# Patient Record
Sex: Female | Born: 1985
Health system: Southern US, Community
[De-identification: ages and names within clinical notes are randomized; demographics above are authoritative.]

## PROBLEM LIST (undated history)

## (undated) DIAGNOSIS — G43829 Menstrual migraine, not intractable, without status migrainosus: Secondary | ICD-10-CM

## (undated) DIAGNOSIS — Z1379 Encounter for other screening for genetic and chromosomal anomalies: Secondary | ICD-10-CM

## (undated) DIAGNOSIS — Z803 Family history of malignant neoplasm of breast: Secondary | ICD-10-CM

## (undated) DIAGNOSIS — F32A Depression, unspecified: Secondary | ICD-10-CM

## (undated) DIAGNOSIS — Z9289 Personal history of other medical treatment: Secondary | ICD-10-CM

## (undated) DIAGNOSIS — Z1371 Encounter for nonprocreative screening for genetic disease carrier status: Secondary | ICD-10-CM

## (undated) DIAGNOSIS — R87619 Unspecified abnormal cytological findings in specimens from cervix uteri: Secondary | ICD-10-CM

## (undated) DIAGNOSIS — Z8041 Family history of malignant neoplasm of ovary: Secondary | ICD-10-CM

## (undated) DIAGNOSIS — B977 Papillomavirus as the cause of diseases classified elsewhere: Secondary | ICD-10-CM

## (undated) DIAGNOSIS — F419 Anxiety disorder, unspecified: Secondary | ICD-10-CM

## (undated) DIAGNOSIS — F329 Major depressive disorder, single episode, unspecified: Secondary | ICD-10-CM

## (undated) DIAGNOSIS — E559 Vitamin D deficiency, unspecified: Secondary | ICD-10-CM

## (undated) DIAGNOSIS — Z9189 Other specified personal risk factors, not elsewhere classified: Secondary | ICD-10-CM

## (undated) HISTORY — DX: Papillomavirus as the cause of diseases classified elsewhere: B97.7

## (undated) HISTORY — DX: Major depressive disorder, single episode, unspecified: F32.9

## (undated) HISTORY — DX: Unspecified abnormal cytological findings in specimens from cervix uteri: R87.619

## (undated) HISTORY — DX: Anxiety disorder, unspecified: F41.9

## (undated) HISTORY — DX: Menstrual migraine, not intractable, without status migrainosus: G43.829

## (undated) HISTORY — PX: NO PAST SURGERIES: SHX2092

## (undated) HISTORY — DX: Family history of malignant neoplasm of ovary: Z80.41

## (undated) HISTORY — DX: Depression, unspecified: F32.A

## (undated) HISTORY — DX: Encounter for other screening for genetic and chromosomal anomalies: Z13.79

## (undated) HISTORY — DX: Vitamin D deficiency, unspecified: E55.9

## (undated) HISTORY — DX: Family history of malignant neoplasm of breast: Z80.3

## (undated) HISTORY — DX: Personal history of other medical treatment: Z92.89

## (undated) HISTORY — DX: Other specified personal risk factors, not elsewhere classified: Z91.89

---
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
## 2006-07-23 DIAGNOSIS — B977 Papillomavirus as the cause of diseases classified elsewhere: Secondary | ICD-10-CM

## 2006-07-23 HISTORY — DX: Papillomavirus as the cause of diseases classified elsewhere: B97.7

## 2006-08-07 ENCOUNTER — Emergency Department (HOSPITAL_COMMUNITY): Admission: EM | Admit: 2006-08-07 | Discharge: 2006-08-07 | Payer: Self-pay | Admitting: Emergency Medicine

## 2006-09-01 ENCOUNTER — Emergency Department (HOSPITAL_COMMUNITY): Admission: EM | Admit: 2006-09-01 | Discharge: 2006-09-01 | Payer: Self-pay | Admitting: *Deleted

## 2007-12-08 IMAGING — CT CT HEAD W/O CM
1 of 2 series · 13 of 30 positions shown, 17 images · IV contrast (agent unspecified)
Comparison: None.

CLINICAL DATA: Headaches for 5 days.  Nausea.  
 HEAD CT WITHOUT CONTRAST:
TECHNIQUE: Contiguous axial images were obtained from the base of the skull through the vertex according to standard protocol without contrast.

[Series 2: brain · axial · 0.47mm/px · z∈[+119,+240]mm · 13 of 28 slices shown, 17 images]
[im 2/28  brain]
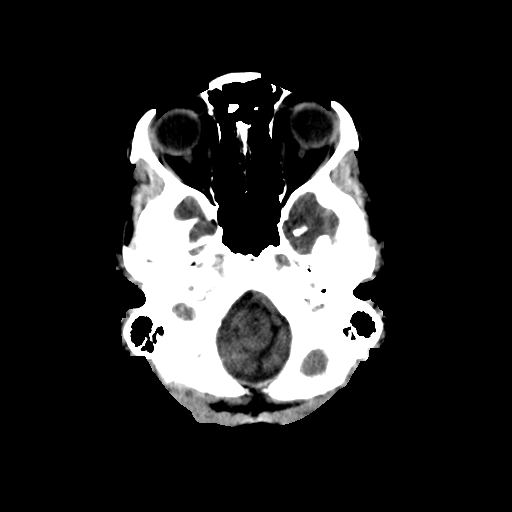
[im 2/28  bone]
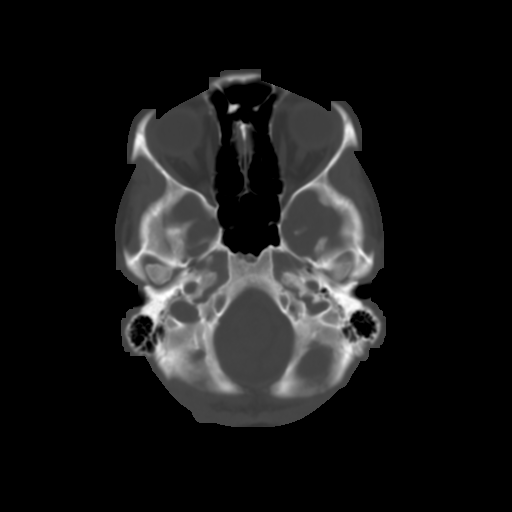
[im 4/28  brain]
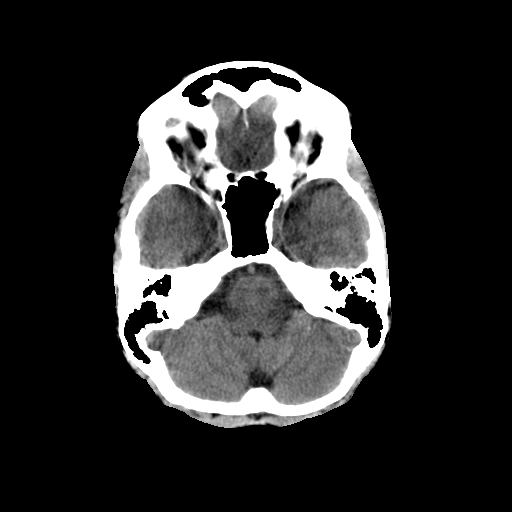
[im 6/28  brain]
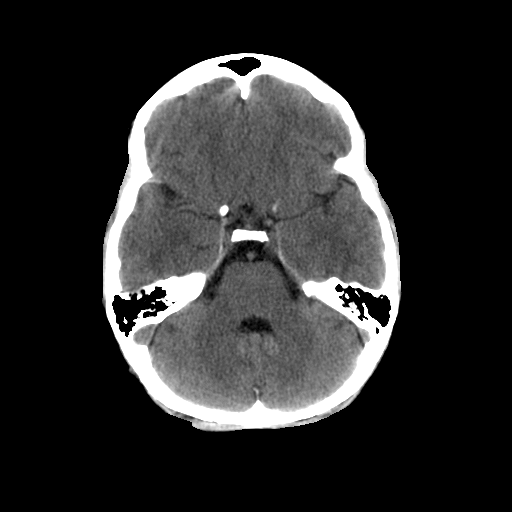
[im 8/28  brain]
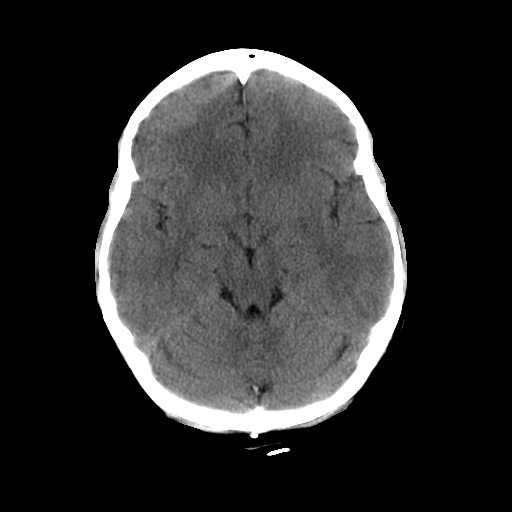
[im 10/28  brain]
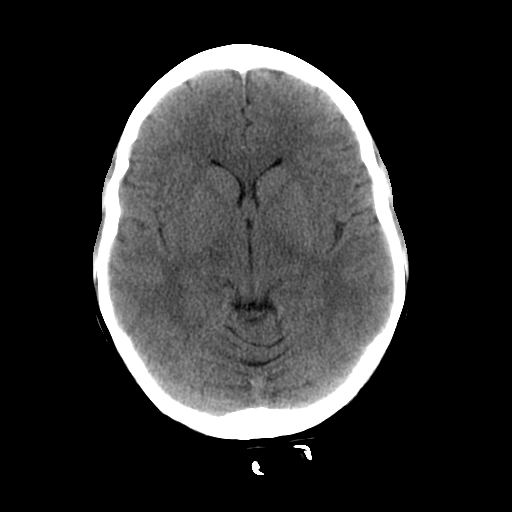
[im 10/28  bone]
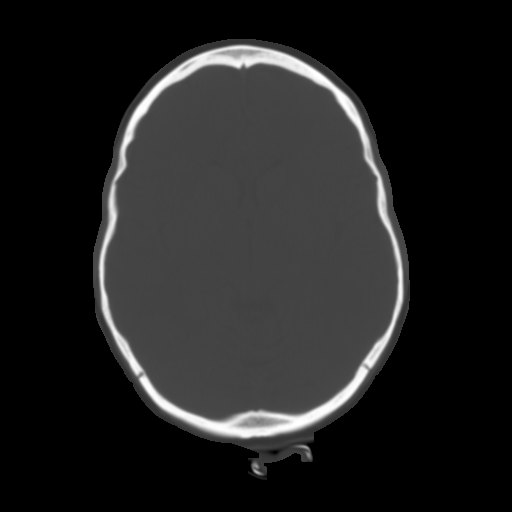
[im 12/28  brain]
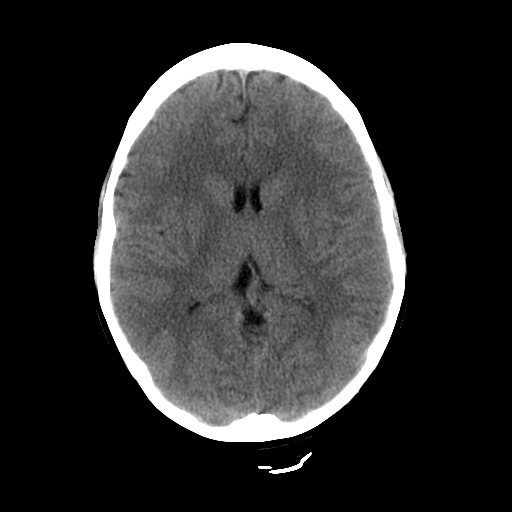
[im 14/28  brain]
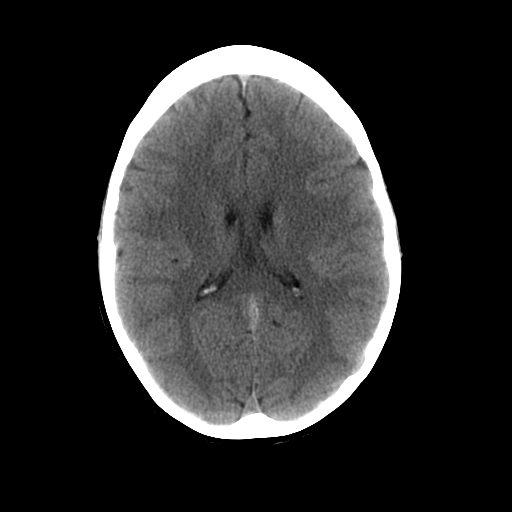
[im 16/28  brain]
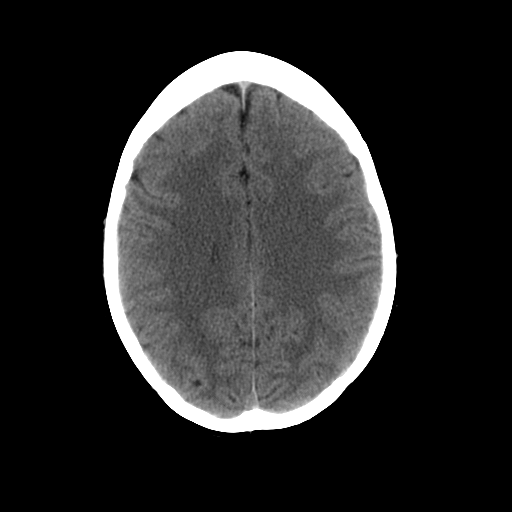
[im 18/28  brain]
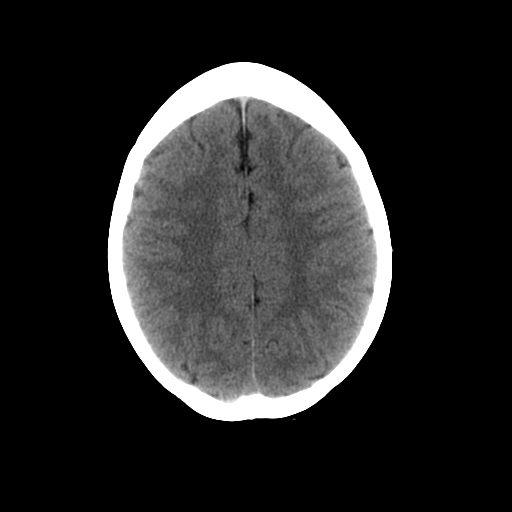
[im 18/28  bone]
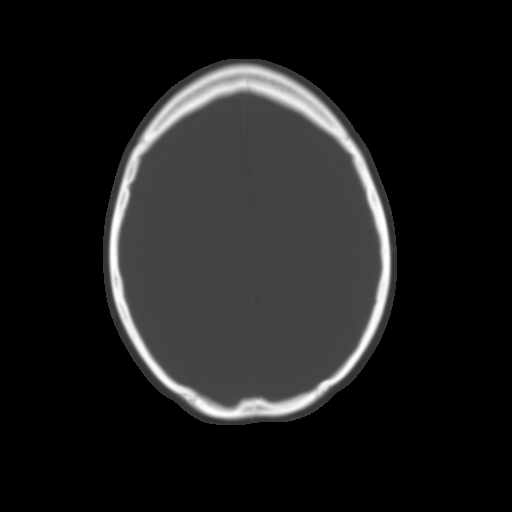
[im 20/28  brain]
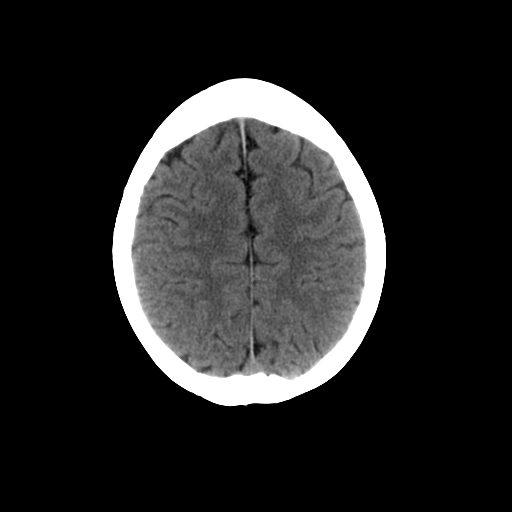
[im 22/28  brain]
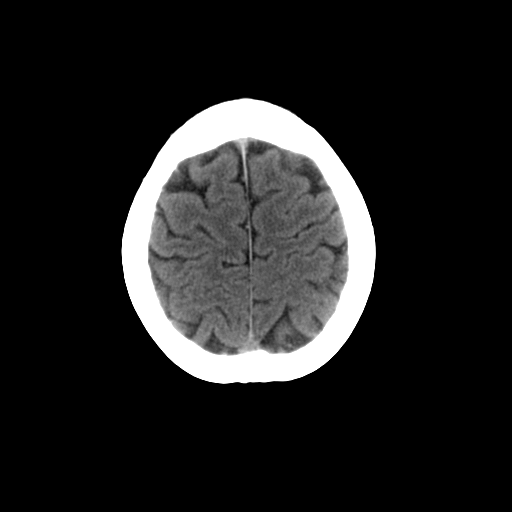
[im 24/28  brain]
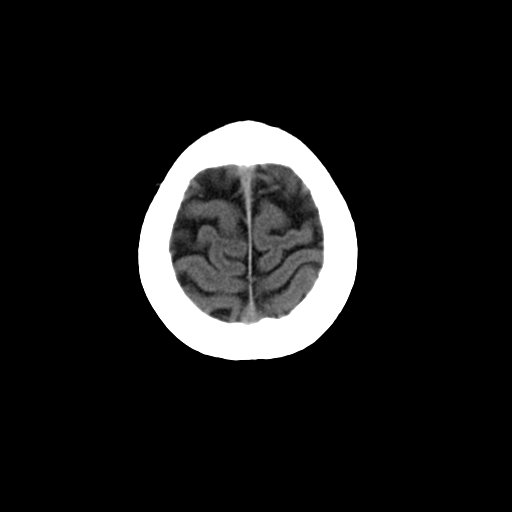
[im 26/28  brain]
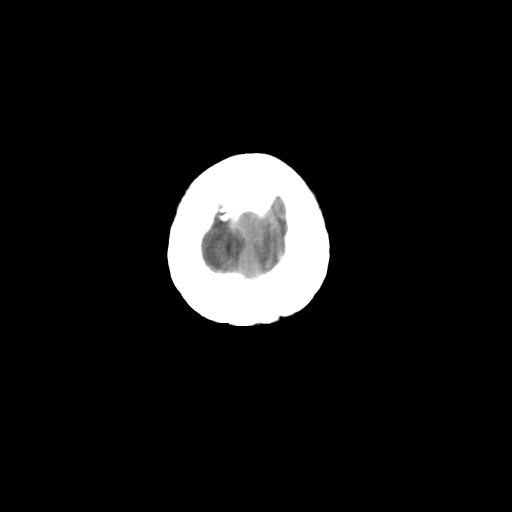
[im 26/28  bone]
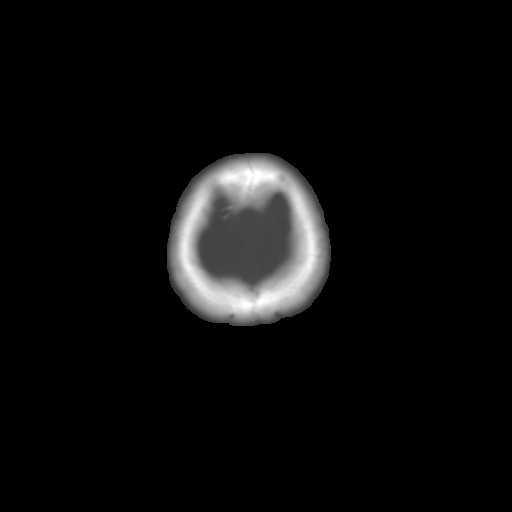

[13 of 30 positions shown; findings below may reference images not displayed]

FINDINGS: The brain parenchyma is normal in attenuation and morphology.  
 No abnormal extraaxial fluid collection, intracranial hemorrhage, or mass is noted.  
 The midline is maintained.  There is no edema or mass effect.  
 Mastoid air cells and paranasal sinuses are normally aerated.
IMPRESSION: No acute intracranial abnormalities.

## 2014-03-11 DIAGNOSIS — Z1371 Encounter for nonprocreative screening for genetic disease carrier status: Secondary | ICD-10-CM

## 2014-03-11 HISTORY — DX: Encounter for nonprocreative screening for genetic disease carrier status: Z13.71

## 2015-07-26 LAB — HM MAMMOGRAPHY

## 2016-07-03 LAB — HM PAP SMEAR

## 2016-12-24 ENCOUNTER — Encounter: Payer: Self-pay | Admitting: Obstetrics and Gynecology

## 2016-12-26 ENCOUNTER — Encounter: Payer: Self-pay | Admitting: Obstetrics and Gynecology

## 2016-12-26 ENCOUNTER — Ambulatory Visit (INDEPENDENT_AMBULATORY_CARE_PROVIDER_SITE_OTHER): Payer: 59 | Admitting: Obstetrics and Gynecology

## 2016-12-26 VITALS — BP 118/70 | Ht 66.0 in | Wt 170.0 lb

## 2016-12-26 DIAGNOSIS — Z8742 Personal history of other diseases of the female genital tract: Secondary | ICD-10-CM

## 2016-12-26 DIAGNOSIS — R5383 Other fatigue: Secondary | ICD-10-CM

## 2016-12-26 DIAGNOSIS — L659 Nonscarring hair loss, unspecified: Secondary | ICD-10-CM | POA: Diagnosis not present

## 2016-12-26 DIAGNOSIS — Z124 Encounter for screening for malignant neoplasm of cervix: Secondary | ICD-10-CM

## 2016-12-26 DIAGNOSIS — Z1151 Encounter for screening for human papillomavirus (HPV): Secondary | ICD-10-CM | POA: Diagnosis not present

## 2016-12-26 DIAGNOSIS — Z87898 Personal history of other specified conditions: Secondary | ICD-10-CM

## 2016-12-26 NOTE — Progress Notes (Signed)
   Chief Complaint  Patient presents with  . Follow-up    6 month pap repeat pap smear     HPI:      Ms. Beth Walsh is a 31 y.o. G0P0000 who LMP was Patient's last menstrual period was 12/19/2016., presents today for repeat pap smear. Pt is s/p LGSIL 1/17, no bx on 2/17 colpo with Dr. Luella Cookosenow. 1/18 pap at annual was neg/POS HPV DNA. Repeat today. Pt stopped OCPs 5/18 in order to conceive. She is taking PNVs. She has noticed fatigue and hair loss recently. She would like lab check.   There are no active problems to display for this patient.   Family History  Problem Relation Age of Onset  . Diabetes Maternal Grandfather   . Diabetes Paternal Grandfather   . Cancer Paternal Aunt [id number]       BREAST  . Cancer Maternal Aunt [id number]    Social History   Social History  . Marital status: Divorced    Spouse name: N/A  . Number of children: 0  . Years of education: 14   Occupational History  . NURSE    Social History Main Topics  . Smoking status: Never Smoker  . Smokeless tobacco: Never Used  . Alcohol use Yes     Comment: OCC   . Drug use: No  . Sexual activity: Yes    Birth control/ protection: Pill   Other Topics Concern  . Not on file   Social History Narrative  . No narrative on file     Current Outpatient Prescriptions:  .  Cholecalciferol (VITAMIN D3) 1000 units CAPS, Take by mouth., Disp: , Rfl:  .  Prenatal Vit-Fe Fumarate-FA (PRENATAL VITAMIN PO), Take by mouth., Disp: , Rfl:   Review of Systems  Constitutional: Positive for fatigue. Negative for fever.  Gastrointestinal: Negative for blood in stool, constipation, diarrhea, nausea and vomiting.  Genitourinary: Negative for dyspareunia, dysuria, flank pain, frequency, hematuria, urgency, vaginal bleeding, vaginal discharge and vaginal pain.  Musculoskeletal: Negative for back pain.  Skin: Negative for rash.     OBJECTIVE:   Vitals:  BP 118/70   Ht 5\' 6"  (1.676 m)   Wt 170 lb (77.1 kg)   LMP  12/19/2016   BMI 27.44 kg/m   Physical Exam  Constitutional: She is oriented to person, place, and time and well-developed, well-nourished, and in no distress. Vital signs are normal.  Genitourinary: Vagina normal, uterus normal, cervix normal, right adnexa normal, left adnexa normal and vulva normal. Uterus is not enlarged. Cervix exhibits no motion tenderness and no tenderness. Right adnexum displays no mass and no tenderness. Left adnexum displays no mass and no tenderness. Vulva exhibits no erythema, no exudate, no lesion, no rash and no tenderness. Vagina exhibits no lesion.  Neurological: She is oriented to person, place, and time.  Vitals reviewed.    Assessment/Plan: Cervical cancer screening - Plan: IGP, Aptima HPV  Screening for HPV (human papillomavirus) - Plan: IGP, Aptima HPV  History of abnormal cervical Pap smear - Will call pt with results.  Fatigue, unspecified type - Check labs.  - Plan: Comprehensive metabolic panel, TSH + free T4  Hair loss - Plan: Comprehensive metabolic panel, TSH + free T4    Return in about 6 months (around 06/28/2017) for annual.  Alicia B. Copland, PA-C 12/26/2016 2:41 PM

## 2016-12-27 LAB — COMPREHENSIVE METABOLIC PANEL
ALK PHOS: 62 IU/L (ref 39–117)
ALT: 13 IU/L (ref 0–32)
AST: 19 IU/L (ref 0–40)
Albumin/Globulin Ratio: 1.6 (ref 1.2–2.2)
Albumin: 4.3 g/dL (ref 3.5–5.5)
BUN/Creatinine Ratio: 22 (ref 9–23)
BUN: 13 mg/dL (ref 6–20)
Bilirubin Total: 0.3 mg/dL (ref 0.0–1.2)
CO2: 24 mmol/L (ref 20–29)
CREATININE: 0.6 mg/dL (ref 0.57–1.00)
Calcium: 9.6 mg/dL (ref 8.7–10.2)
Chloride: 105 mmol/L (ref 96–106)
GFR calc Af Amer: 139 mL/min/{1.73_m2} (ref >59)
GFR calc non Af Amer: 121 mL/min/{1.73_m2} (ref >59)
GLOBULIN, TOTAL: 2.7 g/dL (ref 1.5–4.5)
Glucose: 84 mg/dL (ref 65–99)
POTASSIUM: 4.4 mmol/L (ref 3.5–5.2)
SODIUM: 144 mmol/L (ref 134–144)
Total Protein: 7 g/dL (ref 6.0–8.5)

## 2016-12-27 LAB — TSH+FREE T4
FREE T4: 1.17 ng/dL (ref 0.82–1.77)
TSH: 1.08 u[IU]/mL (ref 0.450–4.500)

## 2016-12-31 LAB — IGP, APTIMA HPV
HPV APTIMA: NEGATIVE
PAP SMEAR COMMENT: 0

## 2017-06-17 DIAGNOSIS — Z3201 Encounter for pregnancy test, result positive: Secondary | ICD-10-CM | POA: Diagnosis not present

## 2017-06-17 DIAGNOSIS — Z3491 Encounter for supervision of normal pregnancy, unspecified, first trimester: Secondary | ICD-10-CM | POA: Diagnosis not present

## 2017-07-02 DIAGNOSIS — Z3689 Encounter for other specified antenatal screening: Secondary | ICD-10-CM | POA: Diagnosis not present

## 2017-07-02 DIAGNOSIS — Z3201 Encounter for pregnancy test, result positive: Secondary | ICD-10-CM | POA: Diagnosis not present

## 2017-07-02 DIAGNOSIS — N912 Amenorrhea, unspecified: Secondary | ICD-10-CM | POA: Diagnosis not present

## 2017-07-31 DIAGNOSIS — R8290 Unspecified abnormal findings in urine: Secondary | ICD-10-CM | POA: Diagnosis not present

## 2017-08-28 DIAGNOSIS — Z3A19 19 weeks gestation of pregnancy: Secondary | ICD-10-CM | POA: Diagnosis not present

## 2017-08-28 DIAGNOSIS — Z363 Encounter for antenatal screening for malformations: Secondary | ICD-10-CM | POA: Diagnosis not present

## 2017-11-01 DIAGNOSIS — Z3689 Encounter for other specified antenatal screening: Secondary | ICD-10-CM | POA: Diagnosis not present

## 2017-11-12 DIAGNOSIS — O99810 Abnormal glucose complicating pregnancy: Secondary | ICD-10-CM | POA: Diagnosis not present

## 2017-11-15 DIAGNOSIS — Z23 Encounter for immunization: Secondary | ICD-10-CM | POA: Diagnosis not present

## 2017-11-29 DIAGNOSIS — Z118 Encounter for screening for other infectious and parasitic diseases: Secondary | ICD-10-CM | POA: Diagnosis not present

## 2017-12-24 DIAGNOSIS — Z3685 Encounter for antenatal screening for Streptococcus B: Secondary | ICD-10-CM | POA: Diagnosis not present

## 2018-01-17 DIAGNOSIS — O4292 Full-term premature rupture of membranes, unspecified as to length of time between rupture and onset of labor: Secondary | ICD-10-CM | POA: Diagnosis not present

## 2018-01-17 DIAGNOSIS — Z3A00 Weeks of gestation of pregnancy not specified: Secondary | ICD-10-CM | POA: Diagnosis not present

## 2018-01-17 DIAGNOSIS — Z79899 Other long term (current) drug therapy: Secondary | ICD-10-CM | POA: Diagnosis not present

## 2018-01-17 DIAGNOSIS — O4202 Full-term premature rupture of membranes, onset of labor within 24 hours of rupture: Secondary | ICD-10-CM | POA: Diagnosis not present

## 2018-01-17 DIAGNOSIS — Z91040 Latex allergy status: Secondary | ICD-10-CM | POA: Diagnosis not present

## 2018-01-17 DIAGNOSIS — Z3A39 39 weeks gestation of pregnancy: Secondary | ICD-10-CM | POA: Diagnosis not present

## 2018-04-29 DIAGNOSIS — R102 Pelvic and perineal pain: Secondary | ICD-10-CM | POA: Diagnosis not present

## 2018-04-29 DIAGNOSIS — N9411 Superficial (introital) dyspareunia: Secondary | ICD-10-CM | POA: Diagnosis not present

## 2018-04-29 DIAGNOSIS — M62838 Other muscle spasm: Secondary | ICD-10-CM | POA: Diagnosis not present

## 2018-04-29 DIAGNOSIS — N94818 Other vulvodynia: Secondary | ICD-10-CM | POA: Diagnosis not present

## 2018-05-06 DIAGNOSIS — R102 Pelvic and perineal pain: Secondary | ICD-10-CM | POA: Diagnosis not present

## 2018-05-06 DIAGNOSIS — M62838 Other muscle spasm: Secondary | ICD-10-CM | POA: Diagnosis not present

## 2018-05-06 DIAGNOSIS — N94818 Other vulvodynia: Secondary | ICD-10-CM | POA: Diagnosis not present

## 2018-05-06 DIAGNOSIS — N9411 Superficial (introital) dyspareunia: Secondary | ICD-10-CM | POA: Diagnosis not present

## 2018-05-13 DIAGNOSIS — M62838 Other muscle spasm: Secondary | ICD-10-CM | POA: Diagnosis not present

## 2018-05-13 DIAGNOSIS — R102 Pelvic and perineal pain: Secondary | ICD-10-CM | POA: Diagnosis not present

## 2018-05-13 DIAGNOSIS — N94818 Other vulvodynia: Secondary | ICD-10-CM | POA: Diagnosis not present

## 2018-05-13 DIAGNOSIS — N9411 Superficial (introital) dyspareunia: Secondary | ICD-10-CM | POA: Diagnosis not present

## 2018-05-19 DIAGNOSIS — M62838 Other muscle spasm: Secondary | ICD-10-CM | POA: Diagnosis not present

## 2018-05-19 DIAGNOSIS — N9411 Superficial (introital) dyspareunia: Secondary | ICD-10-CM | POA: Diagnosis not present

## 2018-05-19 DIAGNOSIS — N94818 Other vulvodynia: Secondary | ICD-10-CM | POA: Diagnosis not present

## 2018-05-19 DIAGNOSIS — R102 Pelvic and perineal pain: Secondary | ICD-10-CM | POA: Diagnosis not present

## 2019-03-22 NOTE — Progress Notes (Signed)
PCP:  Patient, No Pcp Per   Chief Complaint  Patient presents with  . Gynecologic Exam     HPI:      Ms. Beth Walsh is a 33 y.o. G0P0000 who LMP was Patient's last menstrual period was 03/11/2019 (exact date)., presents today for her annual examination.  Her menses are regular every 28-30 days, lasting 7 days on POPs due to BF, but stopped 7/20.  Dysmenorrhea mild, occurring first 1-2 days of flow. She does not have intermenstrual bleeding.  Sex activity: single partner, contraception - oral progesterone-only contraceptive. Would like to go back to previous OCPs. No hx of HTN, DVTs, migraines with aura.  Had dyspareunia after delivery, improved with pelvic PT. Last Pap: December 26, 2016  Results were: no abnormalities /neg HPV DNA  Hx of STDs: HPV on pap  Mammogram: 12/14/16 in [place]; Results: normal, repeat in 12 months. There is a FH of breast cancer in her pat aunt. There is no FH of ovarian cancer in her mat aunt. Pt is MyRisk neg except MUTYH VUS; IBIS=21.8%. The patient does do self-breast exams. Stopped breastfeeding 7/20.  Tobacco use: The patient denies current or previous tobacco use. Alcohol use: none No drug use.  Exercise: moderately active  She does get adequate calcium and Vitamin D in her diet.  Pt complains of feeling foggy since delivery and wonders if depression. Had similar sx with depression in past, treated with celexa. Did well with it but realized how numb she was after going off it. Pt denies PP depression dx. Thought sx were related to breastfeeding and decreased sleep with newborn but no change since stopping. She is [facility] during wk and works [employer], husband is [employer] resident in ED. Pt moved recently and doesn't have new friends in area yet, but doesn't feel troubled by this. Enjoys normal activities. Does feel anxious, with increased worry, sleep issues, little energy, difficulty concentrating. No SI.   Past Medical History:  Diagnosis Date  . Abnormal Pap  smear of cervix 07/23/06; 08/26/07  . Anxiety   . BRCA negative 03/2014   MYRISK NEG X MUTYH VUS; IBIS=21.8%  . Depression   . Family history of breast cancer   . Family history of ovarian cancer   . History of Papanicolaou smear of cervix 02/10/2014; 07/03/16   NEG, HPV POS, NOT HIGH RISK; NEG,HPV POS  . 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 Increased risk of breast cancer    IBIS=21.8%.  . Menstrual migraine   . Vitamin D deficiency     Past Surgical History:  Procedure Laterality Date  . NO PAST SURGERIES      Family History  Problem Relation Age of Onset  . Diabetes Maternal Grandfather   . Diabetes Paternal Grandfather   . Breast cancer Paternal Aunt 78       has contact  . Ovarian cancer Maternal Aunt 108       has contact    Social History   Socioeconomic History  . Marital status: Divorced    Spouse name: Not on file  . Number of children: 0  . Years of education: 60  . Highest education level: Not on file  Occupational History  . Occupation: NURSE  Social Needs  . Financial resource strain: Not on file  . Food insecurity    Worry: Not on file    Inability: Not on file  . Transportation needs    Medical: Not on file  Non-medical: Not on file  Tobacco Use  . Smoking status: Never Smoker  . Smokeless tobacco: Never Used  Substance and Sexual Activity  . Alcohol use: Yes    Comment: OCC   . Drug use: No  . Sexual activity: Yes    Birth control/protection: Pill  Lifestyle  . Physical activity    Days per week: Not on file    Minutes per session: Not on file  . Stress: Not on file  Relationships  . Social connections    Talks on phone: Not on file    Gets together: Not on file    Attends religious service: Not on file    Active member of club or organization: Not on file    Attends meetings of clubs or organizations: Not on file    Relationship status: Not on file  . Intimate partner violence    Fear of current or ex partner: Not on file     Emotionally abused: Not on file    Physically abused: Not on file    Forced sexual activity: Not on file  Other Topics Concern  . Not on file  Social History Narrative  . Not on file     Current Outpatient Medications:  .  norethindrone (MICRONOR) 0.35 MG tablet, Take by mouth., Disp: , Rfl:  .  Norethindrone Acetate-Ethinyl Estrad-FE (MICROGESTIN 24 FE) 1-20 MG-MCG(24) tablet, Take 1 tablet by mouth daily., Disp: 84 tablet, Rfl: 3 .  sertraline (ZOLOFT) 50 MG tablet, Take 1 tablet (50 mg total) by mouth daily. Take 1/2 tab daily for 6 days, then 1 tab daily, Disp: 30 tablet, Rfl: 1     ROS:  Review of Systems  Constitutional: Negative for fatigue, fever and unexpected weight change.  Respiratory: Negative for cough, shortness of breath and wheezing.   Cardiovascular: Negative for chest pain, palpitations and leg swelling.  Gastrointestinal: Positive for constipation. Negative for blood in stool, diarrhea, nausea and vomiting.  Endocrine: Negative for cold intolerance, heat intolerance and polyuria.  Genitourinary: Positive for dyspareunia. Negative for dysuria, flank pain, frequency, genital sores, hematuria, menstrual problem, pelvic pain, urgency, vaginal bleeding, vaginal discharge and vaginal pain.  Musculoskeletal: Negative for back pain, joint swelling and myalgias.  Skin: Negative for rash.  Neurological: Positive for headaches. Negative for dizziness, syncope, light-headedness and numbness.  Hematological: Negative for adenopathy.  Psychiatric/Behavioral: Positive for agitation. Negative for confusion, dysphoric mood, sleep disturbance and suicidal ideas. The patient is not nervous/anxious.   BREAST: No symptoms   Objective: BP 100/80   Ht 5' 6" (1.676 m)   Wt 178 lb (80.7 kg)   LMP 03/11/2019 (Exact Date)   BMI 28.73 kg/m    Physical Exam Constitutional:      Appearance: She is well-developed.  Genitourinary:     Vulva, vagina, cervix, uterus, right adnexa  and left adnexa normal.     No vulval lesion or tenderness noted.     No vaginal discharge, erythema or tenderness.     No cervical polyp.     Uterus is not enlarged or tender.     No right or left adnexal mass present.     Right adnexa not tender.     Left adnexa not tender.  Neck:     Musculoskeletal: Normal range of motion.     Thyroid: No thyromegaly.  Cardiovascular:     Rate and Rhythm: Normal rate and regular rhythm.     Heart sounds: Normal heart sounds. No murmur.  Pulmonary:       Effort: Pulmonary effort is normal.     Breath sounds: Normal breath sounds.  Chest:     Breasts:        Right: No mass, nipple discharge, skin change or tenderness.        Left: No mass, nipple discharge, skin change or tenderness.  Abdominal:     Palpations: Abdomen is soft.     Tenderness: There is no abdominal tenderness. There is no guarding.  Musculoskeletal: Normal range of motion.  Neurological:     General: No focal deficit present.     Mental Status: She is alert and oriented to person, place, and time.     Cranial Nerves: No cranial nerve deficit.  Skin:    General: Skin is warm and dry.  Psychiatric:        Mood and Affect: Mood normal.        Behavior: Behavior normal.        Thought Content: Thought content normal.        Judgment: Judgment normal.  Vitals signs reviewed.     Results:  GAD 7 : Generalized Anxiety Score 03/23/2019  Nervous, Anxious, on Edge 2  Control/stop worrying 2  Worry too much - different things 2  Trouble relaxing 2  Restless 0  Easily annoyed or irritable 2  Afraid - awful might happen 3  Total GAD 7 Score 13  Anxiety Difficulty Somewhat difficult     Depression screen PHQ 2/9 03/23/2019  Decreased Interest 2  Down, Depressed, Hopeless 2  PHQ - 2 Score 4  Altered sleeping 2  Tired, decreased energy 3  Change in appetite 1  Feeling bad or failure about yourself  2  Trouble concentrating 3  Moving slowly or fidgety/restless 0   Suicidal thoughts 0  PHQ-9 Score 15  Difficult doing work/chores Somewhat difficult    Assessment/Plan: Encounter for annual routine gynecological examination  Encounter for initial prescription of contraceptive pills - Plan: Norethindrone Acetate-Ethinyl Estrad-FE (MICROGESTIN 24 FE) 1-20 MG-MCG(24) tablet; OCP change back to [place] since stopped BF. F/u prn.   Anxiety and depression - Plan: sertraline (ZOLOFT) 50 MG tablet; Pt interested in retrying meds. Rx sertraline since felt numb with celexa. F/u in 7 wks/sooner prn.  Family history of breast cancer--Pt is MyRisk neg except MUTYH VS.  Increased risk of breast cancer--IBIS=21.8%. Cont monthly SBE, yearly CBE, started mammos age 29 but pt recently stopped BF. Cont Vit D supp.  Meds ordered this encounter  Medications  . Norethindrone Acetate-Ethinyl Estrad-FE (MICROGESTIN 24 FE) 1-20 MG-MCG(24) tablet    Sig: Take 1 tablet by mouth daily.    Dispense:  84 tablet    Refill:  3    Order Specific Question:   Supervising Provider    Answer:   HARRIS, ROBERT P [[id number]]  . sertraline (ZOLOFT) 50 MG tablet    Sig: Take 1 tablet (50 mg total) by mouth daily. Take 1/2 tab daily for 6 days, then 1 tab daily    Dispense:  30 tablet    Refill:  1    Order Specific Question:   Supervising Provider    Answer:   HARRIS, ROBERT P [[id number]]             GYN counsel use and side effects of OCP's, adequate intake of calcium and vitamin D, diet and exercise     F/U  Return in about 7 weeks (around 05/11/2019) for anxiety/depression f/u.  Alicia B. Copland, PA-C 03/23/2019 4:30 PM 

## 2019-03-23 ENCOUNTER — Other Ambulatory Visit: Payer: Self-pay

## 2019-03-23 ENCOUNTER — Encounter: Payer: Self-pay | Admitting: Obstetrics and Gynecology

## 2019-03-23 ENCOUNTER — Ambulatory Visit (INDEPENDENT_AMBULATORY_CARE_PROVIDER_SITE_OTHER): Payer: BC Managed Care – PPO | Admitting: Obstetrics and Gynecology

## 2019-03-23 VITALS — BP 100/80 | Ht 66.0 in | Wt 178.0 lb

## 2019-03-23 DIAGNOSIS — F329 Major depressive disorder, single episode, unspecified: Secondary | ICD-10-CM

## 2019-03-23 DIAGNOSIS — Z01419 Encounter for gynecological examination (general) (routine) without abnormal findings: Secondary | ICD-10-CM

## 2019-03-23 DIAGNOSIS — Z9189 Other specified personal risk factors, not elsewhere classified: Secondary | ICD-10-CM

## 2019-03-23 DIAGNOSIS — Z30011 Encounter for initial prescription of contraceptive pills: Secondary | ICD-10-CM

## 2019-03-23 DIAGNOSIS — Z803 Family history of malignant neoplasm of breast: Secondary | ICD-10-CM

## 2019-03-23 DIAGNOSIS — F419 Anxiety disorder, unspecified: Secondary | ICD-10-CM

## 2019-03-23 MED ORDER — MICROGESTIN 24 FE 1-20 MG-MCG PO TABS: 1.0000 | ORAL_TABLET | Freq: Every day | ORAL | 3 refills | Status: DC

## 2019-03-23 MED ORDER — SERTRALINE HCL 50 MG PO TABS: 50.0000 mg | ORAL_TABLET | Freq: Every day | ORAL | 1 refills | Status: DC

## 2019-03-23 NOTE — Patient Instructions (Signed)
I value your feedback and entrusting us with your care. If you get a [facility] patient survey, I would appreciate you taking the time to let us know about your experience today. Thank you! 

## 2019-05-12 ENCOUNTER — Other Ambulatory Visit: Payer: Self-pay

## 2019-05-12 ENCOUNTER — Encounter: Payer: Self-pay | Admitting: Obstetrics and Gynecology

## 2019-05-12 ENCOUNTER — Ambulatory Visit (INDEPENDENT_AMBULATORY_CARE_PROVIDER_SITE_OTHER): Payer: BC Managed Care – PPO | Admitting: Obstetrics and Gynecology

## 2019-05-12 VITALS — Ht 66.0 in | Wt 178.0 lb

## 2019-05-12 DIAGNOSIS — F329 Major depressive disorder, single episode, unspecified: Secondary | ICD-10-CM

## 2019-05-12 DIAGNOSIS — F32A Depression, unspecified: Secondary | ICD-10-CM | POA: Insufficient documentation

## 2019-05-12 DIAGNOSIS — F419 Anxiety disorder, unspecified: Secondary | ICD-10-CM | POA: Diagnosis not present

## 2019-05-12 MED ORDER — SERTRALINE HCL 50 MG PO TABS: 75.0000 mg | ORAL_TABLET | Freq: Every day | ORAL | 3 refills | Status: AC

## 2019-05-12 NOTE — Patient Instructions (Signed)
I value your feedback and entrusting us with your care. If you get a [facility] patient survey, I would appreciate you taking the time to let us know about your experience today. Thank you! 

## 2019-05-12 NOTE — Progress Notes (Signed)
Virtual Visit via Telephone Note  I connected with Beth Walsh on 05/12/19 at  2:50 PM EST by telephone and verified that I am speaking with the correct person using two identifiers.   I discussed the limitations, risks, security and privacy concerns of performing an evaluation and management service by telephone and the availability of in person appointments. I also discussed with the patient that there may be a patient responsible charge related to this service. The patient expressed understanding and agreed to proceed. Location of pt: home Location of provider: [facility] Name of [place] for history intake: Drenda Freeze    Chief Complaint  Patient presents with   Follow-up    feeling better    HPI:      Ms. Beth Walsh is a 33 y.o. G1P1001 who LMP was Patient's last menstrual period was 04/20/2019 (approximate)., presents today for anxiety and depression f/u from 10/20 annual. She was started on zoloft 50 mg daily. She states her sx are improved but thinks she may need to increase dose. Still has days where she feels hopeless. No SI. Has more energy and interest in doing things, has met neighbors and making new friends. Concentration is improving but still not normal. Still has sleep issues but this is normal for pt. Can't do sleep meds since has infant son. Takes melatonin with some improvement. Doesn't feel numb like she did with celexa. No side effects.   03/23/19 NOTE: Pt complains of feeling foggy since delivery and wonders if depression. Had similar sx with depression in past, treated with celexa. Did well with it but realized how numb she was after going off it. Pt denies PP depression dx. Thought sx were related to breastfeeding and decreased sleep with newborn but no change since stopping. She is [facility] during wk and works [employer], husband is [employer] resident in ED. Pt moved recently and doesn't have new friends in area yet, but doesn't feel troubled by this. Enjoys  normal activities. Does feel anxious, with increased worry, sleep issues, little energy, difficulty concentrating. No SI.   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   Menstrual migraine    Vitamin D deficiency     Past Surgical History:  Procedure Laterality Date   NO PAST SURGERIES      Family History  Problem Relation Age of Onset   Diabetes Maternal Grandfather    Diabetes Paternal Grandfather    Breast cancer Paternal Aunt 77       has contact   Ovarian cancer Maternal Aunt 40       has contact     Current Outpatient Medications:    Norethindrone Acetate-Ethinyl Estrad-FE (JUNEL FE 24) 1-20 MG-MCG(24) tablet, Take 1 tablet by mouth daily., Disp: , Rfl:    sertraline (ZOLOFT) 50 MG tablet, Take 1.5 tablets (75 mg total) by mouth daily., Disp: 135 tablet, Rfl: 3   OBJECTIVE:    Physical Exam Vitals signs reviewed.  Constitutional:  Appearance: She is well-developed.  Neck:     Musculoskeletal: Normal range of motion.  Pulmonary:     Effort: Pulmonary effort is normal.  Musculoskeletal: Normal range of motion.  Skin:    General: Skin is warm and dry.  Neurological:     General: No focal deficit present.     Mental Status: She is alert and oriented to person, place, and time.     Cranial Nerves: No cranial nerve deficit.  Psychiatric:        Mood and Affect: Mood normal.        Behavior: Behavior normal.        Thought Content: Thought content normal.        Judgment: Judgment normal.     GAD 7 : Generalized Anxiety Score 05/12/2019  03/23/2019  Nervous, Anxious, on Edge 1 2  Control/stop worrying 2 2  Worry too much - different things 2 2  Trouble relaxing 0 2  Restless 0 0  Easily annoyed or irritable 0 2  Afraid - awful might happen 1 3  Total GAD 7 Score 6 13  Anxiety Difficulty Not difficult at all Somewhat difficult   Was 13 at 10/20 appt  Depression screen PHQ 2/9 05/12/2019  Decreased Interest 1  Down, Depressed, Hopeless 1  PHQ - 2 Score 2  Altered sleeping 0  Tired, decreased energy 1  Change in appetite 0  Feeling bad or failure about yourself  1  Trouble concentrating 2  Moving slowly or fidgety/restless 0  Suicidal thoughts 0  PHQ-9 Score 6  Difficult doing work/chores Somewhat difficult   Was 15 at 10/20 appt.   Assessment/Plan: Anxiety and depression - Plan: sertraline (ZOLOFT) 50 MG tablet; sx improved but not relieved. No side effects. Will increase to 75 mg dose. Pt to f/u in 4 wks if still sx and will increase to 100 mg dose. If 75 mg dose if good, pt has Rx RF till annual. F/u prn.    Meds ordered this encounter  Medications   sertraline (ZOLOFT) 50 MG tablet    Sig: Take 1.5 tablets (75 mg total) by mouth daily.    Dispense:  135 tablet    Refill:  3    Order Specific Question:   Supervising Provider    Answer:   Gae Dry [[id number]]    I discussed the assessment and treatment plan with the patient. The patient was provided an opportunity to ask questions and all were answered. The patient agreed with the plan and demonstrated an understanding of the instructions.   The patient was advised to call back or seek an in-person evaluation if the symptoms worsen or if the condition fails to improve as anticipated.  I provided 6 minutes of non-face-to-face time during this encounter.   Lorretta Kerce B. Shantanu Strauch, PA-C 05/12/2019 3:03 PM

## 2020-03-23 ENCOUNTER — Other Ambulatory Visit (HOSPITAL_COMMUNITY)
Admission: RE | Admit: 2020-03-23 | Discharge: 2020-03-23 | Disposition: A | Discharge status: Home / Self Care | Payer: Self-pay | Source: Ambulatory Visit | Attending: Advanced Practice Midwife | Admitting: Advanced Practice Midwife

## 2020-03-23 ENCOUNTER — Ambulatory Visit (INDEPENDENT_AMBULATORY_CARE_PROVIDER_SITE_OTHER): Payer: No Typology Code available for payment source | Admitting: Advanced Practice Midwife

## 2020-03-23 ENCOUNTER — Encounter: Payer: Self-pay | Admitting: Advanced Practice Midwife

## 2020-03-23 ENCOUNTER — Other Ambulatory Visit: Payer: Self-pay

## 2020-03-23 VITALS — BP 102/65 | HR 83 | Wt 177.0 lb

## 2020-03-23 DIAGNOSIS — N912 Amenorrhea, unspecified: Secondary | ICD-10-CM

## 2020-03-23 DIAGNOSIS — Z3490 Encounter for supervision of normal pregnancy, unspecified, unspecified trimester: Secondary | ICD-10-CM | POA: Insufficient documentation

## 2020-03-23 DIAGNOSIS — Z3481 Encounter for supervision of other normal pregnancy, first trimester: Secondary | ICD-10-CM | POA: Insufficient documentation

## 2020-03-23 DIAGNOSIS — Z3A01 Less than 8 weeks gestation of pregnancy: Secondary | ICD-10-CM

## 2020-03-23 DIAGNOSIS — Z113 Encounter for screening for infections with a predominantly sexual mode of transmission: Secondary | ICD-10-CM

## 2020-03-23 DIAGNOSIS — O219 Vomiting of pregnancy, unspecified: Secondary | ICD-10-CM

## 2020-03-23 LAB — POCT URINALYSIS DIPSTICK OB
Appearance: NORMAL
Bilirubin, UA: NEGATIVE
Blood, UA: NEGATIVE
Glucose, UA: NEGATIVE
Ketones, UA: NEGATIVE
Leukocytes, UA: NEGATIVE
Nitrite, UA: NEGATIVE
Odor: NORMAL
POC,PROTEIN,UA: NEGATIVE
Spec Grav, UA: 1.015 (ref 1.010–1.025)
Urobilinogen, UA: 0.2 E.U./dL
pH, UA: 7.5 (ref 5.0–8.0)

## 2020-03-23 LAB — POCT URINE PREGNANCY: Preg Test, Ur: POSITIVE — AB

## 2020-03-23 MED ORDER — ONDANSETRON 4 MG PO TBDP: 4.0000 mg | ORAL_TABLET | Freq: Four times a day (QID) | ORAL | 1 refills | Status: AC | PRN

## 2020-03-23 NOTE — Addendum Note (Signed)
Addended by: Tresea Mall on: 03/23/2020 12:02 PM   Modules accepted: Orders

## 2020-03-23 NOTE — Progress Notes (Signed)
New Obstetric Patient H&P    Chief Complaint: "Desires prenatal care"   History of Present Illness: Patient is a 34 y.o. G2P1001 Not Hispanic or Latino female, presents with amenorrhea and positive home pregnancy test. Patient's last menstrual period was 01/28/2020. and based on her  LMP, her EDD is Estimated Date of Delivery: 11/03/20 and her EGA is [redacted]w[redacted]d. Cycles are 4 days, regular, and occur approximately every : 28 days. Her last pap smear was 3 years ago and was no abnormalities.    She had a urine pregnancy test which was positive 4 week(s)  ago. Her last menstrual period was normal and lasted for  4 day(s). Since her LMP she claims she has experienced breast tenderness, fatigue, nausea. She denies vaginal bleeding. Her past medical history is noncontributory. Her prior pregnancies are notable for G1 2019 FT SVD female 7.5#  Since her LMP, she admits to the use of tobacco products  no She claims she has lost 4 pounds since the start of her pregnancy.  There are cats in the home in the home  no  She admits close contact with children on a regular basis  yes  She has had chicken pox in the past yes She has had Tuberculosis exposures, symptoms, or previously tested positive for TB   no Current or past history of domestic violence. no  Genetic Screening/Teratology Counseling: (Includes patient, baby's father, or anyone in either family with:)   1. Patient's age >/= 37 at [facility]  no 2. Thalassemia ([place], [place], Mediterranean, or Asian background): MCV<80  no 3. Neural tube defect (meningomyelocele, spina bifida, anencephaly)  no 4. Congenital heart defect  no  5. Down syndrome  no 6. Tay-Sachs (Jewish, [place]  no 7. Canavan's Disease  no 8. Sickle cell disease or trait (African)  no  9. Hemophilia or other blood disorders  no  10. Muscular dystrophy  no  11. Cystic fibrosis  no  12. Huntington's Chorea  no  13. Mental retardation/autism  no 14. Other inherited genetic or  chromosomal disorder  no 15. Maternal metabolic disorder (DM, PKU, etc)  no 16. Patient or FOB with a child with a birth defect not listed above no  16a. Patient or FOB with a birth defect themselves no 17. Recurrent pregnancy loss, or stillbirth  no  18. Any medications since LMP other than prenatal vitamins (include vitamins, supplements, OTC meds, drugs, alcohol)  Zoloft for dep/anx 19. 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 Lives with someone with TB or TB exposed  no  2. Patient or partner has history of genital herpes  no 3. Rash or viral illness since LMP  no 4. History of STI (GC, CT, HPV, syphilis, HIV)  CT remote history 5. History of recent travel :  no  Other pertinent information:  Patient lives 1.5 hours from [place] and will likely transfer care to [place] area during the pregnancy.    Review of Systems:10 point review of systems negative unless otherwise noted in HPI  Past Medical History:  Patient Active Problem List   Diagnosis Date Noted  . Supervision of normal pregnancy 03/23/2020    Clinic [facility]renatal [facility]  Dating  Blood type:     Genetic Screen 1 Screen:    AFP:     Quad:     NIPS: Antibody:   Anatomic [place]  Rubella:    Varicella: @VZVIGG @  GTT Early: NA  Third trimester:  RPR:     Rhogam  HBsAg:     Vaccines TDAP:                       Flu Shot: gets @ work Covid: rcd Feb 2021 HIV:     Baby Food Breast                               GBS:   GC/CT:  Contraception  Pap: 2018 neg (5 y interval)  CBB     CS/VBAC NA   Support Person Husband Sattley       . Anxiety and depression 05/12/2019    Past Surgical History:  Past Surgical History:  Procedure Laterality Date  . NO PAST SURGERIES      Gynecologic History: Patient's last menstrual period was 01/28/2020.  Obstetric History: G2P1001  Family History:  Family History  Problem Relation Age of Onset  . Diabetes  Maternal Grandfather   . Diabetes Paternal Grandfather   . Breast cancer Paternal Aunt 35       has contact  . Ovarian cancer Maternal Aunt 40       has contact    Social History:  Social History   Socioeconomic History  . Marital status: Married    Spouse name: Not on file  . Number of children: 0  . Years of education: 95  . Highest education level: Not on file  Occupational History  . Occupation: NURSE  Tobacco Use  . Smoking status: Never Smoker  . Smokeless tobacco: Never Used  Vaping Use  . Vaping Use: Never used  Substance and Sexual Activity  . Alcohol use: Yes    Comment: OCC   . Drug use: No  . Sexual activity: Yes    Birth control/protection: None  Other Topics Concern  . Not on file  Social History Narrative  . Not on file   Social Determinants of Health   Financial Resource Strain:   . Difficulty of Paying Living Expenses: Not on file  Food Insecurity:   . Worried About Programme researcher, broadcasting/film/video in the Last Year: Not on file  . Ran Out of Food in the Last Year: Not on file  Transportation Needs:   . Lack of Transportation (Medical): Not on file  . Lack of Transportation (Non-Medical): Not on file  Physical Activity:   . Days of Exercise per Week: Not on file  . Minutes of Exercise per Session: Not on file  Stress:   . Feeling of Stress : Not on file  Social Connections:   . Frequency of Communication with Friends and Family: Not on file  . Frequency of Social Gatherings with Friends and Family: Not on file  . Attends Religious Services: Not on file  . Active Member of Clubs or Organizations: Not on file  . Attends Banker Meetings: Not on file  . Marital Status: Not on file  Intimate Partner Violence:   . Fear of Current or Ex-Partner: Not on file  . Emotionally Abused: Not on file  . Physically Abused: Not on file  . Sexually Abused: Not on file    Allergies:  Allergies  Allergen Reactions  . Latex Itching and Swelling    Tongue  feels thick    Medications: Prior to Admission medications   Medication Sig Start Date End Date Taking? Authorizing Provider  Multiple Vitamins-Minerals (CENTRUM ADULTS)  TABS  09/20/19  Yes [provider]  ondansetron (ZOFRAN ODT) 4 MG disintegrating tablet Take 1 tablet (4 mg total) by mouth every 6 (six) hours as needed for nausea. 03/23/20   Tresea Mall, CNM  sertraline (ZOLOFT) 50 MG tablet Take 1.5 tablets (75 mg total) by mouth daily. 05/12/19   Copland, Ilona Sorrel, PA-C    Physical Exam Vitals: Blood pressure 102/65, pulse 83, weight 177 lb (80.3 kg), last menstrual period 01/28/2020.  General: NAD HEENT: normocephalic, anicteric Thyroid: no enlargement, no palpable nodules Pulmonary: No increased work of breathing, CTAB Cardiovascular: RRR, distal pulses 2+ Abdomen: NABS, soft, non-tender, non-distended.  Umbilicus without lesions.  No hepatomegaly, splenomegaly or masses palpable. No evidence of hernia  Genitourinary:  External: Normal external female genitalia.  Normal urethral meatus, normal Bartholin's and Skene's glands.    Vagina: Normal vaginal mucosa, no evidence of prolapse.    Cervix: not evaluated  Uterus:  Non-enlarged, mobile, normal contour.    Adnexa: non-tender  Rectal: deferred Extremities: no edema, erythema, or tenderness Neurologic: Grossly intact Psychiatric: mood appropriate, affect full  The following were addressed during this visit:  Breastfeeding Education - Individualized Education    Comments: Contraindications to breastfeeding and other special medical conditions Patient has experience with breastfeeding and plans to breastfeed again. Basics and benefits reviewed at [facility]    Assessment: 34 y.o. G2P1001 at [redacted]w[redacted]d presenting to initiate prenatal care  Plan: 1) Avoid alcoholic beverages. 2) Patient encouraged not to smoke.  3) Discontinue the use of all non-medicinal drugs and chemicals.  4) Take prenatal vitamins daily.  5)  Nutrition, food safety (fish, cheese advisories, and high nitrite foods) and exercise discussed. 6) Hospital and practice style discussed with cross coverage system.  7) Genetic Screening, such as with 1st Trimester Screening, cell free fetal DNA, AFP testing, and Ultrasound, as well as with amniocentesis and CVS as appropriate, is discussed with patient. At the conclusion of today's visit patient requested cell free DNA and MSAFP genetic testing 8) Patient is asked about travel to areas at risk for the Zika virus, and counseled to avoid travel and exposure to mosquitoes or sexual partners who may have themselves been exposed to the virus. Testing is discussed, and will be ordered as appropriate.  9) Aptima, urine culture done today 10) Return to clinic in 2 weeks for ultrasound dating, NOB panel, MaterniT 21   Tresea Mall, CNM [facility] 03/23/2020, 11:16 AM

## 2020-03-23 NOTE — Patient Instructions (Signed)
Exercise During Pregnancy Exercise is an important part of being healthy for people of all ages. Exercise improves the function of your heart and lungs and helps you maintain strength, flexibility, and a healthy body weight. Exercise also boosts energy levels and elevates mood. Most women should exercise regularly during pregnancy. In rare cases, women with certain medical conditions or complications may be asked to limit or avoid exercise during pregnancy. How does this affect me? Along with maintaining general strength and flexibility, exercising during pregnancy can help:  Keep strength in muscles that are used during labor and childbirth.  Decrease low back pain.  Reduce symptoms of depression.  Control weight gain during pregnancy.  Reduce the risk of needing insulin if you develop diabetes during pregnancy.  Decrease the risk of cesarean delivery.  Speed up your recovery after giving birth. How does this affect my baby? Exercise can help you have a healthy pregnancy. Exercise does not cause premature birth. It will not cause your baby to weigh less at birth. What exercises can I do? Many exercises are safe for you to do during pregnancy. Do a variety of exercises that safely increase your heart and breathing rates and help you build and maintain muscle strength. Do exercises exactly as told by your health care provider. You may do these exercises:  Walking or hiking.  Swimming.  Water aerobics.  Riding a stationary bike.  Strength training.  Modified yoga or Pilates. Tell your instructor that you are pregnant. Avoid overstretching, and avoid lying on your back for long periods of time.  Running or jogging. Only choose this type of exercise if you: ? Ran or jogged regularly before your pregnancy. ? Can run or jog and still talk in complete sentences. What exercises should I avoid? Depending on your level of fitness and whether you exercised regularly before your  pregnancy, you may be told to limit high-intensity exercise. You can tell that you are exercising at a high intensity if you are breathing much harder and faster and cannot hold a conversation while exercising. You must avoid:  Contact sports.  Activities that put you at risk for falling on or being hit in the belly, such as downhill skiing, water skiing, surfing, rock climbing, cycling, gymnastics, and horseback riding.  Scuba diving.  Skydiving.  Yoga or Pilates in a room that is heated to high temperatures.  Jogging or running, unless you ran or jogged regularly before your pregnancy. While jogging or running, you should always be able to talk in full sentences. Do not run or jog so fast that you are unable to have a conversation.  Do not exercise at more than 6,000 feet above sea level (high elevation) if you are not used to exercising at high elevation. How do I exercise in a safe way?   Avoid overheating. Do not exercise in very high temperatures.  Wear loose-fitting, breathable clothes.  Avoid dehydration. Drink enough water before, during, and after exercise to keep your urine pale yellow.  Avoid overstretching. Because of hormone changes during pregnancy, it is easy to overstretch muscles, tendons, and ligaments during pregnancy.  Start slowly and ask your health care provider to recommend the types of exercise that are safe for you.  Do not exercise to lose weight. Follow these instructions at home:  Exercise on most days or all days of the week. Try to exercise for 30 minutes a day, 5 days a week, unless your health care provider tells you not to.  If   you actively exercised before your pregnancy and you are healthy, your health care provider may tell you to continue to do moderate to high-intensity exercise.  If you are just starting to exercise or did not exercise much before your pregnancy, your health care provider may tell you to do low to moderate-intensity  exercise. Questions to ask your health care provider  Is exercise safe for me?  What are signs that I should stop exercising?  Does my health condition mean that I should not exercise during pregnancy?  When should I avoid exercising during pregnancy? Stop exercising and contact a health care provider if: You have any unusual symptoms, such as:  Mild contractions of the uterus or cramps in the abdomen.  Dizziness that does not go away when you rest. Stop exercising and get help right away if: You have any unusual symptoms, such as:  Sudden, severe pain in your low back or your belly.  Mild contractions of the uterus or cramps in the abdomen that do not improve with rest and drinking fluids.  Chest pain.  Bleeding or fluid leaking from your vagina.  Shortness of breath. These symptoms may represent a serious problem that is an emergency. Do not wait to see if the symptoms will go away. Get medical help right away. Call your local emergency services (911 in the [place] Do not drive yourself to the hospital. Summary  Most women should exercise regularly throughout pregnancy. In rare cases, women with certain medical conditions or complications may be asked to limit or avoid exercise during pregnancy.  Do not exercise to lose weight during pregnancy.  Your health care provider will tell you what level of physical activity is right for you.  Stop exercising and contact a health care provider if you have mild contractions of the uterus or cramps in the abdomen. Get help right away if these contractions or cramps do not improve with rest and drinking fluids.  Stop exercising and get help right away if you have sudden, severe pain in your low back or belly, chest pain, shortness of breath, or bleeding or leaking of fluid from your vagina. This information is not intended to replace advice given to you by your health care provider. Make sure you discuss any questions you have with your  health care provider. Document Revised: 09/18/2018 Document Reviewed: 07/02/2018 Elsevier Patient Education  2020 [employer] Eating Plan for Pregnant Women While you are pregnant, your body requires additional nutrition to help support your growing baby. You also have a higher need for some vitamins and minerals, such as folic acid, calcium, iron, and vitamin D. Eating a healthy, well-balanced diet is very important for your health and your baby's health. Your need for extra calories varies for the three 3-month segments of your pregnancy (trimesters). For most women, it is recommended to consume:  150 extra calories a day during the first trimester.  300 extra calories a day during the second trimester.  300 extra calories a day during the third trimester. What are tips for following this plan?   Do not try to lose weight or go on a diet during pregnancy.  Limit your overall intake of foods that have "empty calories." These are foods that have little nutritional value, such as sweets, desserts, candies, and sugar-sweetened beverages.  Eat a variety of foods (especially fruits and vegetables) to get a full range of vitamins and minerals.  Take a prenatal vitamin to help meet your additional vitamin and mineral needs   during pregnancy, specifically for folic acid, iron, calcium, and vitamin D.  Remember to stay active. Ask your health care provider what types of exercise and activities are safe for you.  Practice good food safety and cleanliness. Wash your hands before you eat and after you prepare raw meat. Wash all fruits and vegetables well before peeling or eating. Taking these actions can help to prevent food-borne illnesses that can be very dangerous to your baby, such as listeriosis. Ask your health care provider for more information about listeriosis. What does 150 extra calories look like? Healthy options that provide 150 extra calories each day could be any of the  following:  6-8 oz (170-230 g) of plain low-fat yogurt with  cup of berries.  1 apple with 2 teaspoons (11 g) of peanut butter.  Cut-up vegetables with  cup (60 g) of hummus.  8 oz (230 mL) or 1 cup of low-fat chocolate milk.  1 stick of string cheese with 1 medium orange.  1 peanut butter and jelly sandwich that is made with one slice of whole-wheat bread and 1 tsp (5 g) of peanut butter. For 300 extra calories, you could eat two of those healthy options each day. What is a healthy amount of weight to gain? The right amount of weight gain for you is based on your BMI before you became pregnant. If your BMI:  Was less than 18 (underweight), you should gain 28-40 lb (13-18 kg).  Was 18-24.9 (normal), you should gain 25-35 lb (11-16 kg).  Was 25-29.9 (overweight), you should gain 15-25 lb (7-11 kg).  Was 30 or greater (obese), you should gain 11-20 lb (5-9 kg). What if I am having twins or multiples? Generally, if you are carrying twins or multiples:  You may need to eat 300-600 extra calories a day.  The recommended range for total weight gain is 25-54 lb (11-25 kg), depending on your BMI before pregnancy.  Talk with your health care provider to find out about nutritional needs, weight gain, and exercise that is right for you. What foods can I eat?  Fruits All fruits. Eat a variety of colors and types of fruit. Remember to wash your fruits well before peeling or eating. Vegetables All vegetables. Eat a variety of colors and types of vegetables. Remember to wash your vegetables well before peeling or eating. Grains All grains. Choose whole grains, such as whole-wheat bread, oatmeal, or brown rice. Meats and other protein foods Lean meats, including chicken, turkey, fish, and lean cuts of beef, veal, or pork. If you eat fish or seafood, choose options that are higher in omega-3 fatty acids and lower in mercury, such as salmon, herring, mussels, trout, sardines, pollock,  shrimp, crab, and lobster. Tofu. Tempeh. Beans. Eggs. Peanut butter and other nut butters. Make sure that all meats, poultry, and eggs are cooked to food-safe temperatures or "well-done." Two or more servings of fish are recommended each week in order to get the most benefits from omega-3 fatty acids that are found in seafood. Choose fish that are lower in mercury. You can find more information online:  www.fda.gov Dairy Pasteurized milk and milk alternatives (such as almond milk). Pasteurized yogurt and pasteurized cheese. Cottage cheese. Sour cream. Beverages Water. Juices that contain 100% fruit juice or vegetable juice. Caffeine-free teas and decaffeinated coffee. Drinks that contain caffeine are okay to drink, but it is better to avoid caffeine. Keep your total caffeine intake to less than 200 mg each day (which is 12 oz   or 355 mL of coffee, tea, or soda) or the limit as told by your health care provider. Fats and oils Fats and oils are okay to include in moderation. Sweets and desserts Sweets and desserts are okay to include in moderation. Seasoning and other foods All pasteurized condiments. The items listed above may not be a complete list of foods and beverages you can eat. Contact a dietitian for more information. What foods are not recommended? Fruits Unpasteurized fruit juices. Vegetables Raw (unpasteurized) vegetable juices. Meats and other protein foods Lunch meats, bologna, hot dogs, or other deli meats. (If you must eat those meats, reheat them until they are steaming hot.) Refrigerated pat, meat spreads from a meat counter, smoked seafood that is found in the refrigerated section of a store. Raw or undercooked meats, poultry, and eggs. Raw fish, such as sushi or sashimi. Fish that have high mercury content, such as tilefish, shark, swordfish, and king mackerel. To learn more about mercury in fish, talk with your health care provider or look for online resources, such  as:  www.fda.gov Dairy Raw (unpasteurized) milk and any foods that have raw milk in them. Soft cheeses, such as feta, queso blanco, queso fresco, Brie, Camembert cheeses, blue-veined cheeses, and Panela cheese (unless it is made with pasteurized milk, which must be stated on the label). Beverages Alcohol. Sugar-sweetened beverages, such as sodas, teas, or energy drinks. Seasoning and other foods Homemade fermented foods and drinks, such as pickles, sauerkraut, or kombucha drinks. (Store-bought pasteurized versions of these are okay.) Salads that are made in a store or deli, such as ham salad, chicken salad, egg salad, tuna salad, and seafood salad. The items listed above may not be a complete list of foods and beverages you should avoid. Contact a dietitian for more information. Where to find more information To calculate the number of calories you need based on your height, weight, and activity level, you can use an online calculator such as:  www.choosemyplate.gov/MyPlatePlan To calculate how much weight you should gain during pregnancy, you can use an online pregnancy weight gain calculator such as:  www.choosemyplate.gov/pregnancy-weight-gain-calculator Summary  While you are pregnant, your body requires additional nutrition to help support your growing baby.  Eat a variety of foods, especially fruits and vegetables to get a full range of vitamins and minerals.  Practice good food safety and cleanliness. Wash your hands before you eat and after you prepare raw meat. Wash all fruits and vegetables well before peeling or eating. Taking these actions can help to prevent food-borne illnesses, such as listeriosis, that can be very dangerous to your baby.  Do not eat raw meat or fish. Do not eat fish that have high mercury content, such as tilefish, shark, swordfish, and king mackerel. Do not eat unpasteurized (raw) dairy.  Take a prenatal vitamin to help meet your additional vitamin and  mineral needs during pregnancy, specifically for folic acid, iron, calcium, and vitamin D. This information is not intended to replace advice given to you by your health care provider. Make sure you discuss any questions you have with your health care provider. Document Revised: 10/16/2018 Document Reviewed: 02/22/2017 Elsevier Patient Education  2020 Else[employer] Inc. Prenatal Care Prenatal care is health care during pregnancy. It helps you and your unborn baby (fetus) stay as healthy as possible. Prenatal care may be provided by a midwife, a family practice health care provider, or a childbirth and pregnancy specialist (obstetrician). How does this affect me? During pregnancy, you will be closely monitored   for any new conditions that might develop. To lower your risk of pregnancy complications, you and your health care provider will talk about any underlying conditions you have. How does this affect my baby? Early and consistent prenatal care increases the chance that your baby will be healthy during pregnancy. Prenatal care lowers the risk that your baby will be:  Born early (prematurely).  Smaller than expected at birth (small for gestational age). What can I expect at the first prenatal care visit? Your first prenatal care visit will likely be the longest. You should schedule your first prenatal care visit as soon as you know that you are pregnant. Your first visit is a good time to talk about any questions or concerns you have about pregnancy. At your visit, you and your health care provider will talk about:  Your medical history, including: ? Any past pregnancies. ? Your family's medical history. ? The baby's father's medical history. ? Any long-term (chronic) health conditions you have and how you manage them. ? Any surgeries or procedures you have had. ? Any current over-the-counter or prescription medicines, herbs, or supplements you are taking.  Other factors that could pose a risk  to your baby, including:  Your home setting and your stress levels, including: ? Exposure to abuse or violence. ? Household financial strain. ? Mental health conditions you have.  Your daily health habits, including diet and exercise. Your health care provider will also:  Measure your weight, height, and blood pressure.  Do a physical exam, including a pelvic and breast exam.  Perform blood tests and urine tests to check for: ? Urinary tract infection. ? Sexually transmitted infections (STIs). ? Low iron levels in your blood (anemia). ? Blood type and certain proteins on red blood cells (Rh antibodies). ? Infections and immunity to viruses, such as hepatitis B and rubella. ? HIV (human immunodeficiency virus).  Do an ultrasound to confirm your baby's growth and development and to help predict your estimated due date (EDD). This ultrasound is done with a probe that is inserted into the vagina (transvaginal ultrasound).  Discuss your options for genetic screening.  Give you information about how to keep yourself and your baby healthy, including: ? Nutrition and taking vitamins. ? Physical activity. ? How to manage pregnancy symptoms such as nausea and vomiting (morning sickness). ? Infections and substances that may be harmful to your baby and how to avoid them. ? Food safety. ? Dental care. ? Working. ? Travel. ? Warning signs to watch for and when to call your health care provider. How often will I have prenatal care visits? After your first prenatal care visit, you will have regular visits throughout your pregnancy. The visit schedule is often as follows:  Up to week 28 of pregnancy: once every 4 weeks.  28-36 weeks: once every 2 weeks.  After 36 weeks: every week until delivery. Some women may have visits more or less often depending on any underlying health conditions and the health of the baby. Keep all follow-up and prenatal care visits as told by your health care  provider. This is important. What happens during routine prenatal care visits? Your health care provider will:  Measure your weight and blood pressure.  Check for fetal heart sounds.  Measure the height of your uterus in your abdomen (fundal height). This may be measured starting around week 20 of pregnancy.  Check the position of your baby inside your uterus.  Ask questions about your diet, sleeping patterns, and   whether you can feel the baby move.  Review warning signs to watch for and signs of labor.  Ask about any pregnancy symptoms you are having and how you are dealing with them. Symptoms may include: ? Headaches. ? Nausea and vomiting. ? Vaginal discharge. ? Swelling. ? Fatigue. ? Constipation. ? Any discomfort, including back or pelvic pain. Make a list of questions to ask your health care provider at your routine visits. What tests might I have during prenatal care visits? You may have blood, urine, and imaging tests throughout your pregnancy, such as:  Urine tests to check for glucose, protein, or signs of infection.  Glucose tests to check for a form of diabetes that can develop during pregnancy (gestational diabetes mellitus). This is usually done around week 24 of pregnancy.  An ultrasound to check your baby's growth and development and to check for birth defects. This is usually done around week 20 of pregnancy.  A test to check for group B strep (GBS) infection. This is usually done around week 36 of pregnancy.  Genetic testing. This may include blood or imaging tests, such as an ultrasound. Some genetic tests are done during the first trimester and some are done during the second trimester. What else can I expect during prenatal care visits? Your health care provider may recommend getting certain vaccines during pregnancy. These may include:  A yearly flu shot (annual influenza vaccine). This is especially important if you will be pregnant during flu  season.  Tdap (tetanus, diphtheria, pertussis) vaccine. Getting this vaccine during pregnancy can protect your baby from whooping cough (pertussis) after birth. This vaccine may be recommended between weeks 27 and 36 of pregnancy. Later in your pregnancy, your health care provider may give you information about:  Childbirth and breastfeeding classes.  Choosing a health care provider for your baby.  Umbilical cord banking.  Breastfeeding.  Birth control after your baby is born.  The hospital labor and delivery unit and how to tour it.  Registering at the hospital before you go into labor. Where to find more information  Office on Women's Health: womenshealth.gov  American Pregnancy Association: americanpregnancy.org  March of Dimes: marchofdimes.org Summary  Prenatal care helps you and your baby stay as healthy as possible during pregnancy.  Your first prenatal care visit will most likely be the longest.  You will have visits and tests throughout your pregnancy to monitor your health and your baby's health.  Bring a list of questions to your visits to ask your health care provider.  Make sure to keep all follow-up and prenatal care visits with your health care provider. This information is not intended to replace advice given to you by your health care provider. Make sure you discuss any questions you have with your health care provider. Document Revised: 09/17/2018 Document Reviewed: 05/27/2017 Elsevier Patient Education  2020 Elsevier Inc.  

## 2020-03-25 LAB — CERVICOVAGINAL ANCILLARY ONLY
Chlamydia: NEGATIVE
Comment: NEGATIVE
Comment: NEGATIVE
Comment: NORMAL
Neisseria Gonorrhea: NEGATIVE
Trichomonas: NEGATIVE

## 2020-03-26 LAB — URINE CULTURE

## 2020-04-05 ENCOUNTER — Ambulatory Visit (INDEPENDENT_AMBULATORY_CARE_PROVIDER_SITE_OTHER): Payer: No Typology Code available for payment source

## 2020-04-05 ENCOUNTER — Other Ambulatory Visit: Payer: Self-pay | Admitting: Advanced Practice Midwife

## 2020-04-05 ENCOUNTER — Ambulatory Visit (INDEPENDENT_AMBULATORY_CARE_PROVIDER_SITE_OTHER): Payer: No Typology Code available for payment source | Admitting: Advanced Practice Midwife

## 2020-04-05 ENCOUNTER — Encounter: Payer: Self-pay | Admitting: Advanced Practice Midwife

## 2020-04-05 ENCOUNTER — Other Ambulatory Visit: Payer: Self-pay

## 2020-04-05 VITALS — BP 120/60 | Wt 174.0 lb

## 2020-04-05 DIAGNOSIS — Z3481 Encounter for supervision of other normal pregnancy, first trimester: Secondary | ICD-10-CM | POA: Diagnosis not present

## 2020-04-05 DIAGNOSIS — Z113 Encounter for screening for infections with a predominantly sexual mode of transmission: Secondary | ICD-10-CM

## 2020-04-05 DIAGNOSIS — Z3A09 9 weeks gestation of pregnancy: Secondary | ICD-10-CM

## 2020-04-05 LAB — POCT URINALYSIS DIPSTICK OB
Glucose, UA: NEGATIVE
POC,PROTEIN,UA: NEGATIVE

## 2020-04-05 NOTE — Progress Notes (Signed)
No VB, no LOF

## 2020-04-05 NOTE — Progress Notes (Signed)
  Routine Prenatal Care Visit  Subjective  Beth Walsh is a 34 y.o. G2P1001 at [redacted]w[redacted]d being seen today for ongoing prenatal care.  She is currently monitored for the following issues for this low-risk pregnancy and has Anxiety and depression and Supervision of normal pregnancy on their problem list.  ----------------------------------------------------------------------------------- Patient reports nausea improving some- still has many food aversions.    . Vag. Bleeding: None.   . Leaking Fluid denies.  ----------------------------------------------------------------------------------- The following portions of the patient's history were reviewed and updated as appropriate: allergies, current medications, past family history, past medical history, past social history, past surgical history and problem list. Problem list updated.  Objective  Blood pressure 120/60, weight 174 lb (78.9 kg), last menstrual period 01/28/2020. Pregravid weight 181 lb (82.1 kg) Total Weight Gain -7 lb (-3.175 kg) Urinalysis: Urine Protein Negative  Urine Glucose Negative  Fetal Status: Fetal Heart Rate (bpm): 169          Dating scan: [redacted]w[redacted]d, no adjustment of EDD for 1 day difference, EDD by LMP  General:  Alert, oriented and cooperative. Patient is in no acute distress.  Skin: Skin is warm and dry. No rash noted.   Cardiovascular: Normal heart rate noted  Respiratory: Normal respiratory effort, no problems with respiration noted  Abdomen: Soft, gravid, appropriate for gestational age.       Pelvic:  Cervical exam deferred        Extremities: Normal range of motion.     Mental Status: Normal mood and affect. Normal behavior. Normal judgment and thought content.   Assessment   34 y.o. G2P1001 at [redacted]w[redacted]d by  11/03/2020, by Last Menstrual Period presenting for routine prenatal visit  Plan   pregnancy 2 Problems (from 01/28/20 to present)    Problem Noted Resolved   Supervision of normal pregnancy 03/23/2020 by  Tresea Mall, CNM No   Overview Signed 03/23/2020 11:08 AM by Tresea Mall, CNM    [facility] Prenatal Labs  Dating EDD by LMP c/w 9w u/s Blood type:     Genetic Screen 1 Screen:    AFP:     Quad:     NIPS: Antibody:   Anatomic [place]  Rubella:    Varicella: @VZVIGG @  GTT Early: NA               Third trimester:  RPR:     Rhogam  HBsAg:     Vaccines TDAP:                       Flu Shot: gets @ work Covid: rcd Feb 2021 HIV:     Baby Food Breast                               GBS:   GC/CT:  Contraception  Pap: 2018 neg (5 y interval)  CBB     CS/VBAC NA   Support Person Husband Travis           MaterniT 21 and NOB panel today   Preterm labor symptoms and general obstetric precautions including but not limited to vaginal bleeding, contractions, leaking of fluid and fetal movement were reviewed in detail with the patient.   Return in about 4 weeks (around 05/03/2020) for rob.  05/05/2020, CNM 04/05/2020 11:51 AM

## 2020-04-06 LAB — RPR+RH+ABO+RUB AB+AB SCR+CB...
Antibody Screen: NEGATIVE
HIV Screen 4th Generation wRfx: NONREACTIVE
Hematocrit: 39.1 % (ref 34.0–46.6)
Hemoglobin: 13.2 g/dL (ref 11.1–15.9)
Hepatitis B Surface Ag: NEGATIVE
MCH: 29.7 pg (ref 26.6–33.0)
MCHC: 33.8 g/dL (ref 31.5–35.7)
MCV: 88 fL (ref 79–97)
Platelets: 314 10*3/uL (ref 150–450)
RBC: 4.45 x10E6/uL (ref 3.77–5.28)
RDW: 13.4 % (ref 11.7–15.4)
RPR Ser Ql: NONREACTIVE
Rh Factor: POSITIVE
Rubella Antibodies, IGG: 1.36 index (ref >0.99)
Varicella zoster IgG: 251 index (ref >165)
WBC: 8.6 10*3/uL (ref 3.4–10.8)

## 2020-04-11 LAB — MATERNIT 21 PLUS CORE, BLOOD
Fetal Fraction: 6
Result (T21): NEGATIVE
Trisomy 13 (Patau syndrome): NEGATIVE
Trisomy 18 (Edwards syndrome): NEGATIVE
Trisomy 21 (Down syndrome): NEGATIVE

## 2020-05-03 ENCOUNTER — Encounter: Payer: No Typology Code available for payment source | Admitting: Obstetrics

## 2020-07-18 ENCOUNTER — Other Ambulatory Visit: Payer: Self-pay | Admitting: Obstetrics and Gynecology

## 2020-07-18 DIAGNOSIS — F419 Anxiety disorder, unspecified: Secondary | ICD-10-CM
